# Patient Record
Sex: Male | Born: 1980 | Hispanic: No | Marital: Married | Smoking: Current every day smoker
Health system: Southern US, Community
[De-identification: ages and names within clinical notes are randomized; demographics above are authoritative.]

## PROBLEM LIST (undated history)

## (undated) ENCOUNTER — Ambulatory Visit: Discharge status: Absent without leave | Payer: Medicaid - Out of State

## (undated) DIAGNOSIS — I10 Essential (primary) hypertension: Secondary | ICD-10-CM

---

## 2008-08-17 ENCOUNTER — Emergency Department (HOSPITAL_COMMUNITY): Admission: EM | Admit: 2008-08-17 | Discharge: 2008-08-17 | Payer: Self-pay | Admitting: Emergency Medicine

## 2020-12-12 ENCOUNTER — Encounter (HOSPITAL_COMMUNITY): Payer: Self-pay | Admitting: Emergency Medicine

## 2020-12-12 ENCOUNTER — Emergency Department (HOSPITAL_COMMUNITY)
Admission: EM | Admit: 2020-12-12 | Discharge: 2020-12-12 | Disposition: A | Discharge status: Home / Self Care | Payer: Medicaid - Out of State | Attending: Emergency Medicine | Admitting: Emergency Medicine

## 2020-12-12 DIAGNOSIS — L02414 Cutaneous abscess of left upper limb: Secondary | ICD-10-CM | POA: Diagnosis not present

## 2020-12-12 DIAGNOSIS — I10 Essential (primary) hypertension: Secondary | ICD-10-CM | POA: Insufficient documentation

## 2020-12-12 DIAGNOSIS — L089 Local infection of the skin and subcutaneous tissue, unspecified: Secondary | ICD-10-CM | POA: Diagnosis present

## 2020-12-12 DIAGNOSIS — Z23 Encounter for immunization: Secondary | ICD-10-CM | POA: Diagnosis not present

## 2020-12-12 HISTORY — DX: Essential (primary) hypertension: I10

## 2020-12-12 MED ORDER — DOXYCYCLINE HYCLATE 100 MG PO TABS
100.0000 mg | ORAL_TABLET | Freq: Once | ORAL | Status: AC
  Administered 2020-12-12: 100 mg via ORAL
  Filled 2020-12-12: qty 1

## 2020-12-12 MED ORDER — LIDOCAINE-EPINEPHRINE 1 %-1:100000 IJ SOLN
20.0000 mL | Freq: Once | INTRAMUSCULAR | Status: AC
  Administered 2020-12-12: 20 mL
  Filled 2020-12-12: qty 1

## 2020-12-12 MED ORDER — DOXYCYCLINE HYCLATE 100 MG PO CAPS: 100.0000 mg | ORAL_CAPSULE | Freq: Two times a day (BID) | ORAL | 0 refills | Status: AC

## 2020-12-12 MED ORDER — IBUPROFEN 600 MG PO TABS: 600.0000 mg | ORAL_TABLET | Freq: Four times a day (QID) | ORAL | 0 refills | Status: AC | PRN

## 2020-12-12 MED ORDER — TETANUS-DIPHTH-ACELL PERTUSSIS 5-2.5-18.5 LF-MCG/0.5 IM SUSY
0.5000 mL | PREFILLED_SYRINGE | Freq: Once | INTRAMUSCULAR | Status: AC
  Administered 2020-12-12: 0.5 mL via INTRAMUSCULAR
  Filled 2020-12-12: qty 0.5

## 2020-12-12 NOTE — Discharge Instructions (Signed)
Please apply warm moist compress to affected area several times daily to help aid with healing.  Keep dressing over the wound.  Take antibiotic as prescribed.  Return if your symptoms worsen if you have fever or if you have any concern.

## 2020-12-12 NOTE — ED Provider Notes (Signed)
[facility] EMERGENCY DEPARTMENT Provider Note   CSN: [id number] Arrival date & time: 12/12/20  0941     History Chief Complaint  Patient presents with  . Insect Bite    Kerry Glover is a 40 y.o. male.  The history is provided by the patient. No language interpreter was used.     40 year old male with history of hypertension presenting for evaluation of skin infection.  Patient noticed a spot on his left forearm approximately a week ago as well as similar spots on his left thigh.  It has been painful, mildly itchy, throbbing, 5 out of 10 pain radiates down towards his hand with increased swelling for that duration.  Symptoms minimally improved with warm compress.  He believes he may have been bitten by insects at the place he is currently staying at.  He denies any change in soap detergent new pets or new medication.  Did endorse some fever several days prior.  He has not been vaccinated for COVID-19 but denies any COVID symptoms.  Unsure of his last tetanus status.  Past Medical History:  Diagnosis Date  . Hypertension     There are no problems to display for this patient.    The histories are not reviewed yet. Please review them in the "History" navigator section and refresh this SmartLink.     No family history on file.     Home Medications Prior to Admission medications   Not on File    Allergies    Lisinopril  Review of Systems   Review of Systems  Constitutional: Positive for fever.  Skin: Positive for rash.    Physical Exam Updated Vital Signs BP 118/71   Pulse 66   Temp 99.2 F (37.3 C) (Oral)   Resp 16   Ht 5\' 10"  (1.778 m)   Wt 72.6 kg   SpO2 100%   BMI 22.96 kg/m   Physical Exam Vitals and nursing note reviewed.  Constitutional:      General: He is not in acute distress.    Appearance: He is well-developed and well-nourished.  HENT:     Head: Atraumatic.  Eyes:     Conjunctiva/sclera: Conjunctivae normal.   Cardiovascular:     Rate and Rhythm: Normal rate and regular rhythm.     Pulses: Normal pulses.     Heart sounds: Normal heart sounds.  Pulmonary:     Effort: Pulmonary effort is normal.     Breath sounds: Normal breath sounds.  Abdominal:     General: Abdomen is flat.     Palpations: Abdomen is soft.  Musculoskeletal:     Cervical back: Neck supple.  Skin:    Findings: No rash.     Comments: Right forearm: There is a dime size area of ulceration noted to mid dorsum of the forearm with surrounding erythema and minimal fluctuance appreciated.  It is warm to the touch and tender.  3 more scattered area of mildly indurated without fluctuance lesion approximately less than 1 cm noted to posterior left thigh and medial left, mildly tender to palpation without surrounding erythema.  Neurological:     Mental Status: He is alert.  Psychiatric:        Mood and Affect: Mood and affect and mood normal.     ED Results / Procedures / Treatments   Labs (all labs ordered are listed, but only abnormal results are displayed) Labs Reviewed - No data to display  EKG None  Radiology No results  found.  Procedures .Marland KitchenIncision and Drainage  Date/Time: 12/12/2020 12:48 PM Performed by: Fayrene Helper, PA-C Authorized by: Fayrene Helper, PA-C   Consent:    Consent obtained:  Verbal   Consent given by:  Patient   Risks discussed:  Bleeding, incomplete drainage, pain and damage to other organs   Alternatives discussed:  No treatment Universal protocol:    Procedure explained and questions answered to patient or proxy's satisfaction: yes     Relevant documents present and verified: yes     Test results available : yes     Imaging studies available: yes     Required blood products, implants, devices, and special equipment available: yes     Site/side marked: yes     Immediately prior to procedure, a time out was called: yes     Patient identity confirmed:  Verbally with patient Location:     Type:  Abscess   Size:  3cm   Location:  Upper extremity   Upper extremity location:  Arm   Arm location:  L lower arm Pre-procedure details:    Skin preparation:  Betadine Sedation:    Sedation type:  None Anesthesia:    Anesthesia method:  Local infiltration   Local anesthetic:  Lidocaine 1% WITH epi Procedure type:    Complexity:  Simple Procedure details:    Ultrasound guidance: no     Needle aspiration: no     Incision types:  Single straight   Incision depth:  Subcutaneous   Scalpel blade:  11   Wound management:  Probed and deloculated   Drainage:  Purulent   Drainage amount:  Scant   Wound treatment:  Wound left open   Packing materials:  None Post-procedure details:    Procedure completion:  Tolerated well, no immediate complications   (including critical care time)  Medications Ordered in ED Medications - No data to display  ED Course  I have reviewed the triage vital signs and the nursing notes.  Pertinent labs & imaging results that were available during my care of the patient were reviewed by me and considered in my medical decision making (see chart for details).    MDM Rules/Calculators/A&P                          BP 118/71   Pulse 66   Temp 99.2 F (37.3 C) (Oral)   Resp 16   Ht 5\' 10"  (1.778 m)   Wt 72.6 kg   SpO2 100%   BMI 22.96 kg/m   Final Clinical Impression(s) / ED Diagnoses Final diagnoses:  Abscess of left forearm    Rx / DC Orders ED Discharge Orders         Ordered    doxycycline (VIBRAMYCIN) 100 MG capsule  2 times daily        12/12/20 1251    ibuprofen (ADVIL) 600 MG tablet  Every 6 hours PRN        12/12/20 1251         12:13 PM Patient here with multiple small lesions noted to his left posterior thigh as well as a larger lesion noted to his left mid forearm consistent with skin infection.  He has a small developing abscess to the mid forearm amenable for incision and drainage.  Will update tetanus, give antibiotic,  and will perform I&D.  He does not have any severe headache and nuchal rigidity concerning for meningitis.  Doubt tick bite, doubt Lyme  disease or [facility] spotted fever.  Not likely syphilitic infection.  12:49 PM Successful incision and drainage of left forearm abscess with scant purulent discharge that was expressed by me.  Will discharge home with antibiotic and return precaution.  Patient otherwise stable for discharge.   Fayrene Helper, PA-C 12/12/20 1252    Milagros Loll, MD 12/13/20 [phone]

## 2020-12-12 NOTE — ED Triage Notes (Signed)
Patient complains of what he believes was an insect bite on his left forearm and left thigh, first noticed wound approximately one week ago.

## 2021-04-21 ENCOUNTER — Other Ambulatory Visit: Payer: Self-pay

## 2021-04-21 ENCOUNTER — Encounter (HOSPITAL_COMMUNITY): Payer: Self-pay

## 2021-04-21 ENCOUNTER — Ambulatory Visit (HOSPITAL_COMMUNITY)
Admission: EM | Admit: 2021-04-21 | Discharge: 2021-04-21 | Disposition: A | Discharge status: Home / Self Care | Payer: 59 | Attending: Emergency Medicine | Admitting: Emergency Medicine

## 2021-04-21 DIAGNOSIS — F17210 Nicotine dependence, cigarettes, uncomplicated: Secondary | ICD-10-CM | POA: Insufficient documentation

## 2021-04-21 DIAGNOSIS — B349 Viral infection, unspecified: Secondary | ICD-10-CM | POA: Diagnosis present

## 2021-04-21 DIAGNOSIS — U071 COVID-19: Secondary | ICD-10-CM | POA: Diagnosis not present

## 2021-04-21 DIAGNOSIS — Z888 Allergy status to other drugs, medicaments and biological substances status: Secondary | ICD-10-CM | POA: Insufficient documentation

## 2021-04-21 NOTE — ED Provider Notes (Signed)
[facility]    CSN: [id number] Arrival date & time: 04/21/21  1657      History   Chief Complaint Chief Complaint  Patient presents with  . Headache  . Emesis    HPI Kerry Glover is a 40 y.o. male.   40 year old male patient presents emergency room with chief complaint of headache body aches chills fever for 3 days.  Patient states his wife and daughter have same.  Here for COVID test because work requires it.  Patient states he is improving, drinking well, feels better.  The history is provided by the patient. No language interpreter was used.    Past Medical History:  Diagnosis Date  . Hypertension     Patient Active Problem List   Diagnosis Date Noted  . Nonspecific syndrome suggestive of viral illness 04/21/2021    History reviewed. No pertinent surgical history.     Home Medications    Prior to Admission medications   Medication Sig Start Date End Date Taking? Authorizing Provider  doxycycline (VIBRAMYCIN) 100 MG capsule Take 1 capsule (100 mg total) by mouth 2 (two) times daily. One po bid x 7 days 12/12/20   Fayrene Helper, PA-C  ibuprofen (ADVIL) 600 MG tablet Take 1 tablet (600 mg total) by mouth every 6 (six) hours as needed. 12/12/20   Fayrene Helper, PA-C    Family History History reviewed. No pertinent family history.  Social History Social History   Tobacco Use  . Smoking status: Current Every Day Smoker  . Smokeless tobacco: Never Used     Allergies   Lisinopril   Review of Systems Review of Systems  Constitutional: Positive for chills and fever.  Gastrointestinal: Positive for diarrhea, nausea and vomiting.  Neurological: Positive for headaches.  All other systems reviewed and are negative.    Physical Exam Triage Vital Signs ED Triage Vitals  Enc Vitals Group     BP 04/21/21 1812 138/88     Pulse Rate 04/21/21 1812 (!) 57     Resp 04/21/21 1812 20     Temp 04/21/21 1812 98.4 F (36.9 C)     Temp Source  04/21/21 1812 Oral     SpO2 04/21/21 1812 100 %     Weight --      Height --      Head Circumference --      Peak Flow --      Pain Score 04/21/21 1811 0     Pain Loc --      Pain Edu? --      Excl. in GC? --    No data found.  Updated Vital Signs BP 138/88 (BP Location: Right Arm)   Pulse (!) 57   Temp 98.4 F (36.9 C) (Oral)   Resp 20   SpO2 100%   Visual Acuity Right Eye Distance:   Left Eye Distance:   Bilateral Distance:    Right Eye Near:   Left Eye Near:    Bilateral Near:     Physical Exam Vitals and nursing note reviewed.  Constitutional:      General: He is not in acute distress.    Appearance: He is well-developed and well-groomed. He is not ill-appearing or toxic-appearing.  HENT:     Head: Normocephalic.     Right Ear: Tympanic membrane is retracted.     Left Ear: Tympanic membrane is retracted.     Nose: Mucosal edema present.     Mouth/Throat:     Lips:  Pink.     Mouth: Mucous membranes are moist.     Pharynx: Oropharynx is clear. Uvula midline.  Eyes:     General: Lids are normal.     Conjunctiva/sclera: Conjunctivae normal.     Pupils: Pupils are equal, round, and reactive to light.  Cardiovascular:     Rate and Rhythm: Normal rate and regular rhythm.     Heart sounds: Normal heart sounds.  Pulmonary:     Effort: Pulmonary effort is normal. No respiratory distress.     Breath sounds: Normal breath sounds. No decreased breath sounds or wheezing.  Abdominal:     General: There is no distension.     Palpations: Abdomen is soft.  Musculoskeletal:        General: Normal range of motion.     Cervical back: Normal range of motion.  Skin:    General: Skin is warm and dry.     Findings: No rash.  Neurological:     General: No focal deficit present.     Mental Status: He is alert and oriented to person, place, and time.     GCS: GCS eye subscore is 4. GCS verbal subscore is 5. GCS motor subscore is 6.     Cranial Nerves: Cranial nerves are  intact. No cranial nerve deficit.     Sensory: Sensation is intact. No sensory deficit.     Motor: Motor function is intact.     Coordination: Coordination is intact.     Gait: Gait is intact.  Psychiatric:        Attention and Perception: Attention normal.        Mood and Affect: Mood normal.        Speech: Speech normal.        Behavior: Behavior normal. Behavior is cooperative.      UC Treatments / Results  Labs (all labs ordered are listed, but only abnormal results are displayed) Labs Reviewed  SARS CORONAVIRUS 2 (TAT 6-24 HRS)    EKG   Radiology No results found.  Procedures Procedures (including critical care time)  Medications Ordered in UC Medications - No data to display  Initial Impression / Assessment and Plan / UC Course  I have reviewed the triage vital signs and the nursing notes.  Pertinent labs & imaging results that were available during my care of the patient were reviewed by me and considered in my medical decision making (see chart for details).     ddx viral illness, URI, COVID Final Clinical Impressions(s) / UC Diagnoses   Final diagnoses:  Nonspecific syndrome suggestive of viral illness     Discharge Instructions     Rest, push fluids, take over-the-counter medicines as label directed for symptom management.  Follow-up with your PCP.  Follow your work protocol regarding quarantine    ED Prescriptions    None     PDMP not reviewed this encounter.   Clancy Gourd, NP 04/21/21 [phone]

## 2021-04-21 NOTE — ED Triage Notes (Signed)
Pt c/o body aches, headaches, vomiting x 2 days. He states he had a fever last night.

## 2021-04-21 NOTE — Discharge Instructions (Signed)
Rest, push fluids, take over-the-counter medicines as label directed for symptom management.  Follow-up with your PCP.  Follow your work protocol regarding quarantine

## 2021-04-22 LAB — SARS CORONAVIRUS 2 (TAT 6-24 HRS): SARS Coronavirus 2: POSITIVE — AB

## 2021-04-23 ENCOUNTER — Telehealth: Payer: Self-pay | Admitting: Adult Health

## 2021-04-23 NOTE — Telephone Encounter (Signed)
Called to discuss with patient about COVID-19 symptoms and the use of one of the available treatments for those with mild to moderate Covid symptoms and at a high risk of hospitalization.  Pt appears to qualify for outpatient treatment due to co-morbid conditions and/or a member of an at-risk group in accordance with the FDA Emergency Use Authorization.     Unable to reach pt - non working phone numbers in chart   Noreene Filbert

## 2021-06-21 ENCOUNTER — Emergency Department (HOSPITAL_COMMUNITY)
Admission: EM | Admit: 2021-06-21 | Discharge: 2021-06-21 | Disposition: A | Discharge status: Home / Self Care | Payer: 59 | Attending: Student | Admitting: Student

## 2021-06-21 ENCOUNTER — Emergency Department (HOSPITAL_COMMUNITY): Payer: 59

## 2021-06-21 ENCOUNTER — Other Ambulatory Visit: Payer: Self-pay

## 2021-06-21 DIAGNOSIS — R519 Headache, unspecified: Secondary | ICD-10-CM | POA: Diagnosis present

## 2021-06-21 DIAGNOSIS — Z5321 Procedure and treatment not carried out due to patient leaving prior to being seen by health care provider: Secondary | ICD-10-CM | POA: Insufficient documentation

## 2021-06-21 LAB — CBC
HCT: 37.6 % — ABNORMAL LOW (ref 39.0–52.0)
Hemoglobin: 12 g/dL — ABNORMAL LOW (ref 13.0–17.0)
MCH: 26.9 pg (ref 26.0–34.0)
MCHC: 31.9 g/dL (ref 30.0–36.0)
MCV: 84.3 fL (ref 80.0–100.0)
Platelets: 261 10*3/uL (ref 150–400)
RBC: 4.46 MIL/uL (ref 4.22–5.81)
RDW: 16.4 % — ABNORMAL HIGH (ref 11.5–15.5)
WBC: 10.3 10*3/uL (ref 4.0–10.5)
nRBC: 0 % (ref 0.0–0.2)

## 2021-06-21 LAB — BASIC METABOLIC PANEL
Anion gap: 5 (ref 5–15)
BUN: 12 mg/dL (ref 6–20)
CO2: 26 mmol/L (ref 22–32)
Calcium: 8.7 mg/dL — ABNORMAL LOW (ref 8.9–10.3)
Chloride: 108 mmol/L (ref 98–111)
Creatinine, Ser: 0.92 mg/dL (ref 0.61–1.24)
GFR, Estimated: >60 mL/min (ref >60)
Glucose, Bld: 82 mg/dL (ref 70–99)
Potassium: 4 mmol/L (ref 3.5–5.1)
Sodium: 139 mmol/L (ref 135–145)

## 2021-06-21 MED ORDER — ACETAMINOPHEN 500 MG PO TABS
1000.0000 mg | ORAL_TABLET | Freq: Once | ORAL | Status: AC
  Administered 2021-06-21: 1000 mg via ORAL
  Filled 2021-06-21: qty 2

## 2021-06-21 NOTE — ED Triage Notes (Signed)
Pt reports being out of Losartan x 2 days. Endorses headache. Advised to come to ED by PCP.

## 2021-06-21 NOTE — ED Provider Notes (Signed)
Emergency Medicine Provider Triage Evaluation Note  Kerry Glover , a 40 y.o. male  was evaluated in triage.  Pt complains of headache.  Patient states he has a history of high blood pressure, has been out of his blood pressure medicine for several days.  He recently moved to the area, does not a PCP and thus is not able to get a refill.  He woke up today with a headache, thus came to the ER.  He has not taken anything for it.  No other neurologic deficits.  No chest pain.  The only other medication he takes is BuSpar.  Review of Systems  Positive: ha Negative: cp  Physical Exam  BP (!) 147/98   Pulse (!) 55   Temp 98.3 F (36.8 C) (Oral)   Resp 16   SpO2 100%  Gen:   Awake, no distress   Resp:  Normal effort  MSK:   Moves extremities without difficulty  Other:  CN intact.  Strength and sensation intact x4.  Negative pronator.  Medical Decision Making  Medically screening exam initiated at 10:55 AM.  Appropriate orders placed.  [name] was informed that the remainder of the evaluation will be completed by another provider, this initial triage assessment does not replace that evaluation, and the importance of remaining in the ED until their evaluation is complete.  Due to HA, will order ct head and basic labs   Alveria Apley, PA-C 06/21/21 1055    Tegeler, Canary Brim, MD 06/21/21 1601

## 2021-06-21 NOTE — ED Notes (Signed)
Pt called for vitals, no response. 

## 2021-06-21 NOTE — ED Notes (Signed)
Pt called with no response 

## 2022-12-22 IMAGING — CT CT HEAD W/O CM
4 of 5 series · 14 of 47 positions shown, 16 images · non-contrast
Comparison: None.

CLINICAL DATA: Headache.

EXAM:
CT HEAD WITHOUT CONTRAST
TECHNIQUE: Contiguous axial images were obtained from the base of the skull
through the vertex without intravenous contrast.

[Series 3: head without · axial · non-contrast · 0.43mm/px · z∈[-35,+25]mm · 3 of 31 slices shown]
[im 7/31  brain]
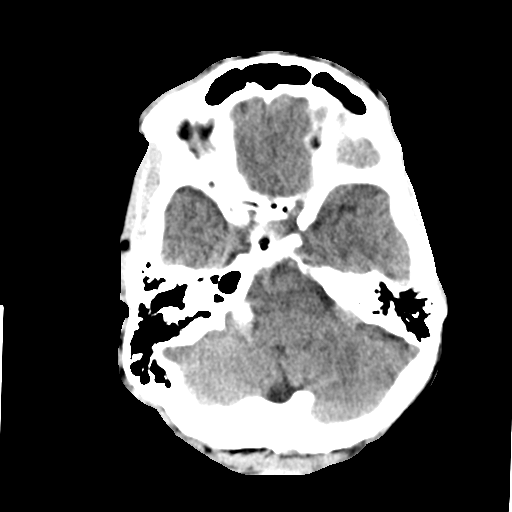
[im 13/31  brain]
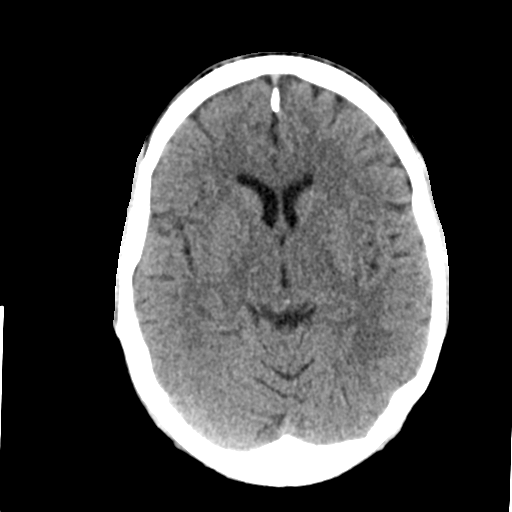
[im 19/31  brain]
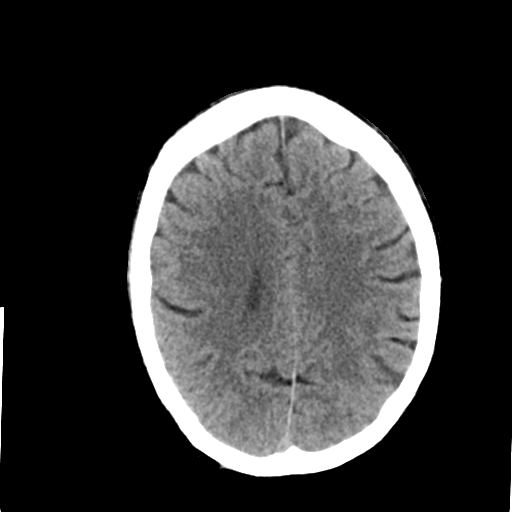

[Series 5: head without cor · coronal · non-contrast · 0.29mm/px · 3 of 65 slices shown]
[im 22/65  brain]
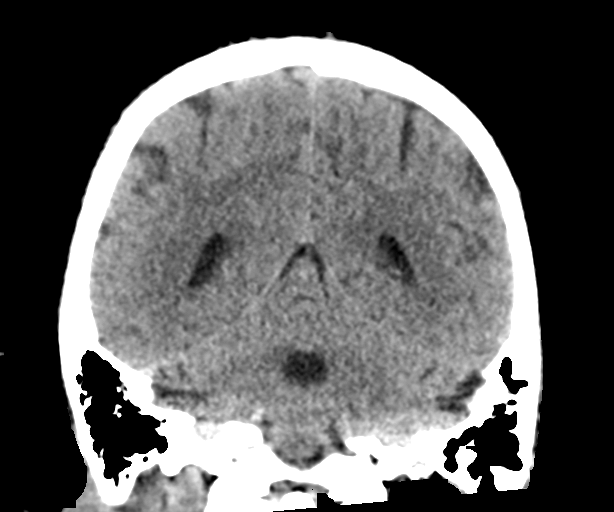
[im 29/65  brain]
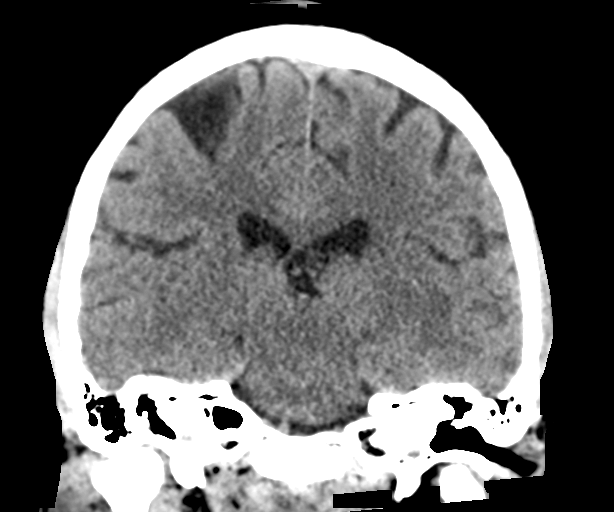
[im 36/65  brain]
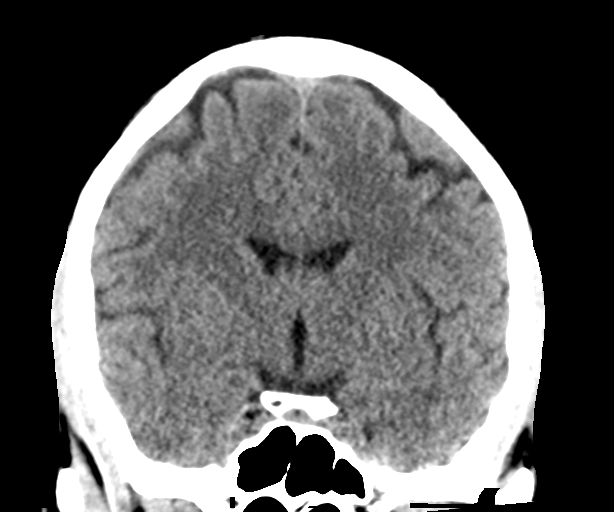

[Series 6: head without sag · sagittal · non-contrast · 0.29mm/px · 3 of 50 slices shown]
[im 17/50  brain]
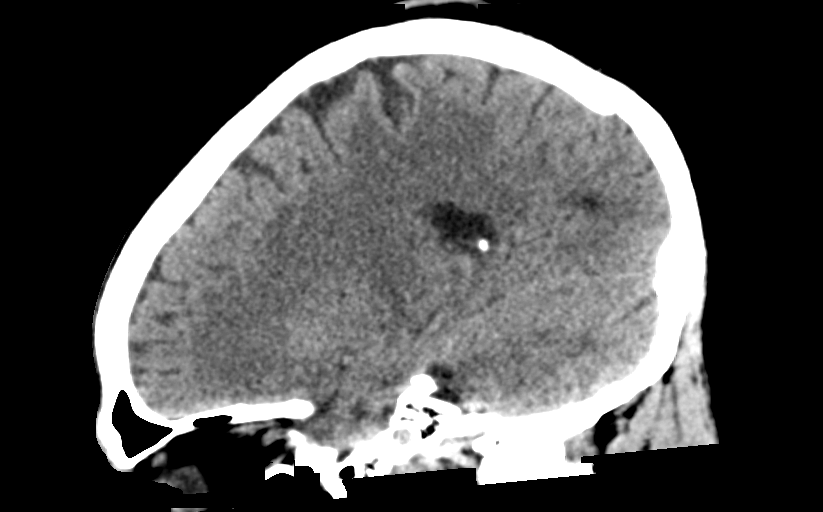
[im 25/50  brain]
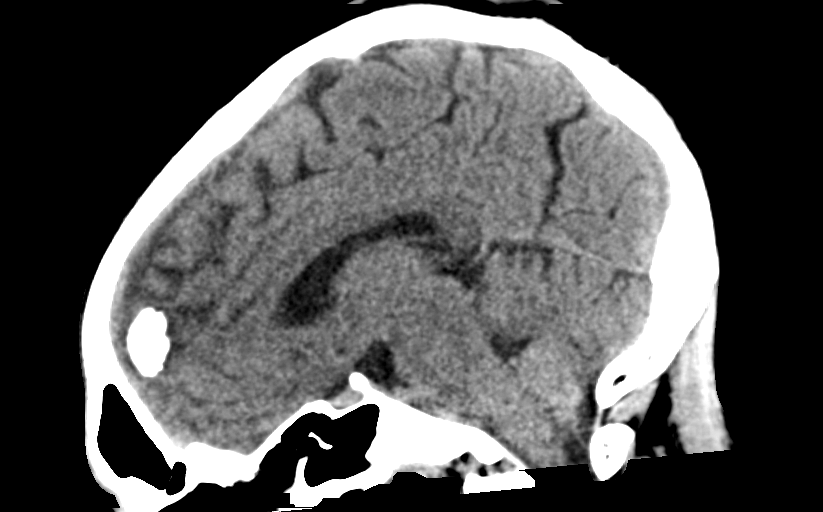
[im 33/50  brain]
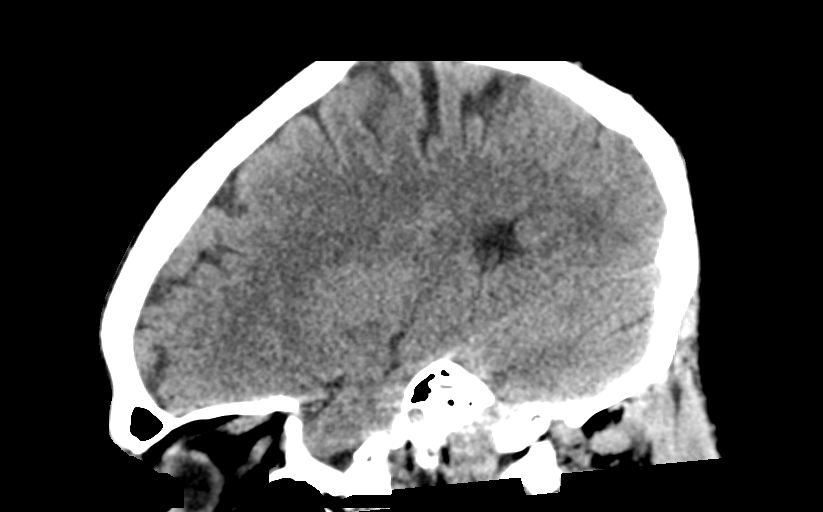

[Series 7: head without ax · axial · non-contrast · 0.38mm/px · z∈[-22,+78]mm · 5 of 31 slices shown, 7 images]
[im 6/31  brain]
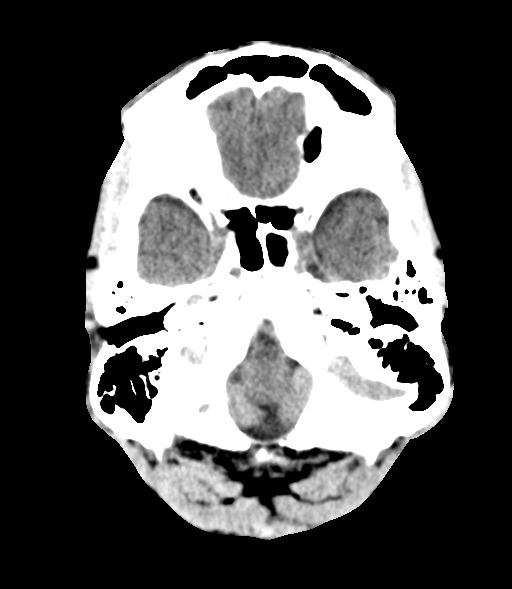
[im 6/31  bone]
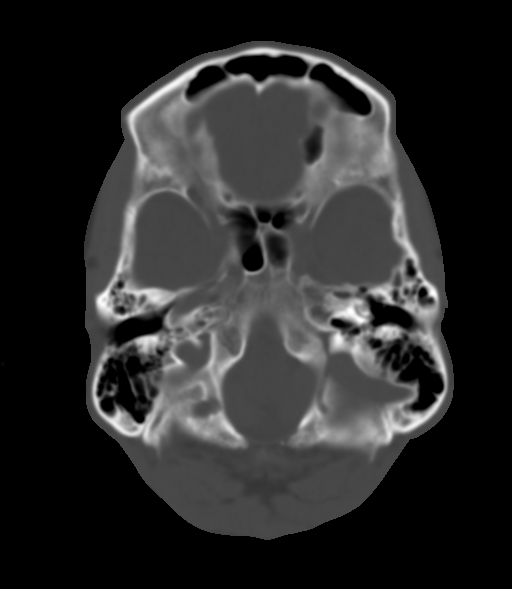
[im 11/31  brain]
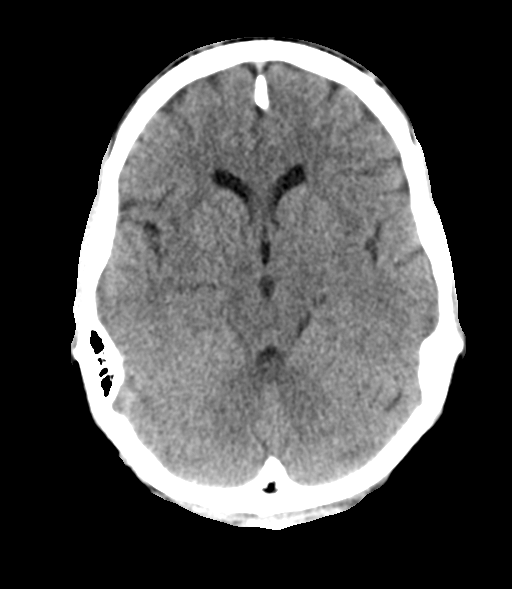
[im 16/31  brain]
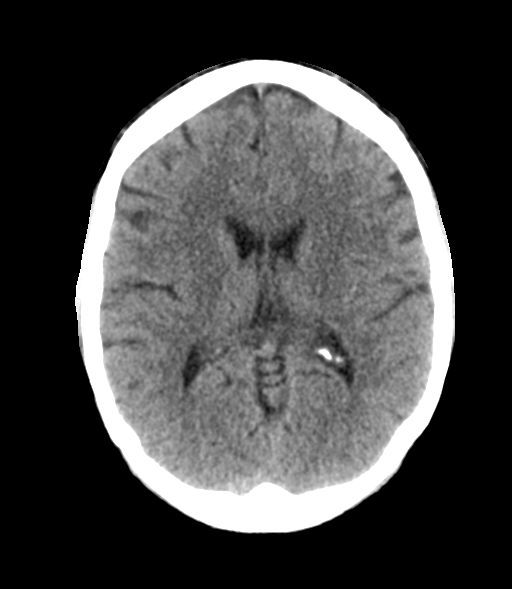
[im 21/31  brain]
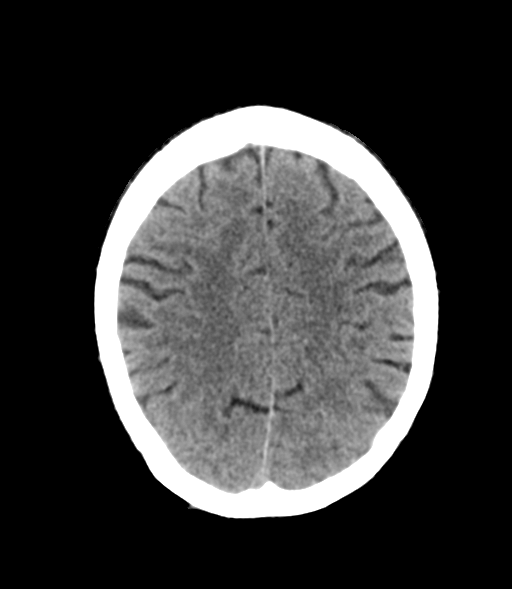
[im 26/31  brain]
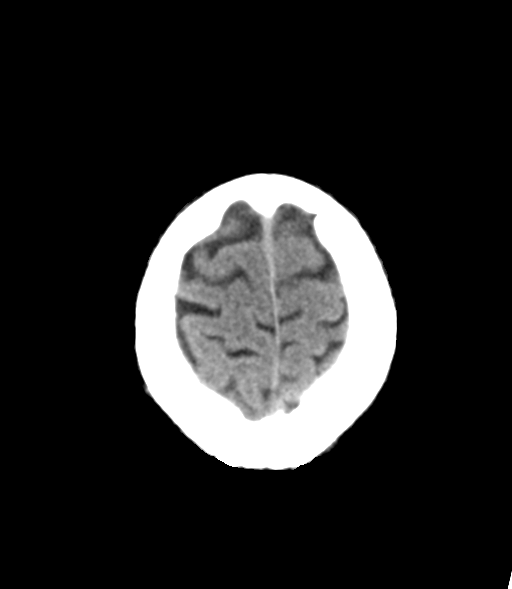
[im 26/31  bone]
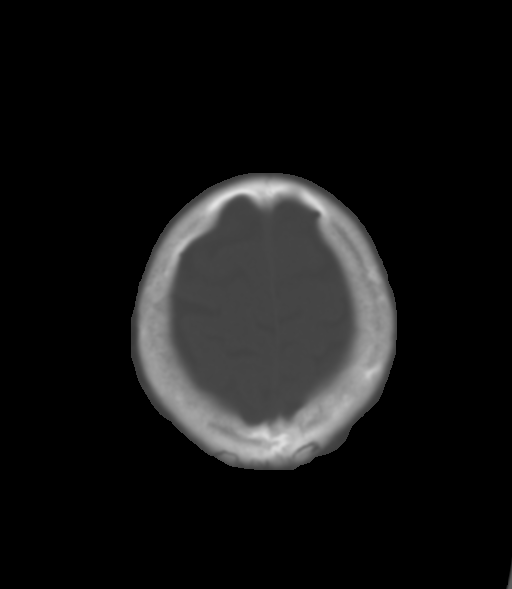

[14 of 47 positions shown; findings below may reference images not displayed]

FINDINGS: Brain: No evidence of acute infarction, hemorrhage, hydrocephalus,
extra-axial collection or mass lesion/mass effect.

Vascular: No hyperdense vessel or unexpected calcification.

Skull: Normal. Negative for fracture or focal lesion.

Sinuses/Orbits: A small amount of fluid is seen in inferior
posterior left mastoid air cells without bony erosion or overlying
soft tissue swelling. Sinuses are normal.

Other: None.
IMPRESSION: 1. Minimal fluid in left mastoid air cells without bony erosion or
soft tissue swelling.
2. No acute intracranial abnormalities.

## 2023-06-16 ENCOUNTER — Other Ambulatory Visit: Payer: Self-pay

## 2023-06-16 ENCOUNTER — Emergency Department (HOSPITAL_COMMUNITY)
Admission: EM | Admit: 2023-06-16 | Discharge: 2023-06-16 | Disposition: A | Discharge status: Home / Self Care | Payer: 59 | Attending: Emergency Medicine | Admitting: Emergency Medicine

## 2023-06-16 DIAGNOSIS — I10 Essential (primary) hypertension: Secondary | ICD-10-CM | POA: Insufficient documentation

## 2023-06-16 DIAGNOSIS — Z87891 Personal history of nicotine dependence: Secondary | ICD-10-CM | POA: Insufficient documentation

## 2023-06-16 DIAGNOSIS — E860 Dehydration: Secondary | ICD-10-CM | POA: Insufficient documentation

## 2023-06-16 DIAGNOSIS — E871 Hypo-osmolality and hyponatremia: Secondary | ICD-10-CM | POA: Diagnosis not present

## 2023-06-16 DIAGNOSIS — R42 Dizziness and giddiness: Secondary | ICD-10-CM | POA: Diagnosis present

## 2023-06-16 LAB — COMPREHENSIVE METABOLIC PANEL
ALT: 20 U/L (ref 0–44)
AST: 29 U/L (ref 15–41)
Albumin: 3.7 g/dL (ref 3.5–5.0)
Alkaline Phosphatase: 47 U/L (ref 38–126)
Anion gap: 13 (ref 5–15)
BUN: 18 mg/dL (ref 6–20)
CO2: 22 mmol/L (ref 22–32)
Calcium: 8.8 mg/dL — ABNORMAL LOW (ref 8.9–10.3)
Chloride: 92 mmol/L — ABNORMAL LOW (ref 98–111)
Creatinine, Ser: 1.59 mg/dL — ABNORMAL HIGH (ref 0.61–1.24)
GFR, Estimated: 55 mL/min — ABNORMAL LOW (ref >60)
Glucose, Bld: 123 mg/dL — ABNORMAL HIGH (ref 70–99)
Potassium: 4 mmol/L (ref 3.5–5.1)
Sodium: 127 mmol/L — ABNORMAL LOW (ref 135–145)
Total Bilirubin: 0.7 mg/dL (ref 0.3–1.2)
Total Protein: 6.2 g/dL — ABNORMAL LOW (ref 6.5–8.1)

## 2023-06-16 LAB — URINALYSIS, ROUTINE W REFLEX MICROSCOPIC
Bilirubin Urine: NEGATIVE
Glucose, UA: NEGATIVE mg/dL
Hgb urine dipstick: NEGATIVE
Ketones, ur: NEGATIVE mg/dL
Leukocytes,Ua: NEGATIVE
Nitrite: NEGATIVE
Protein, ur: NEGATIVE mg/dL
Specific Gravity, Urine: 1.013 (ref 1.005–1.030)
pH: 5 (ref 5.0–8.0)

## 2023-06-16 LAB — CBC
HCT: 39.3 % (ref 39.0–52.0)
Hemoglobin: 13.3 g/dL (ref 13.0–17.0)
MCH: 28.1 pg (ref 26.0–34.0)
MCHC: 33.8 g/dL (ref 30.0–36.0)
MCV: 83.1 fL (ref 80.0–100.0)
Platelets: 286 10*3/uL (ref 150–400)
RBC: 4.73 MIL/uL (ref 4.22–5.81)
RDW: 13.9 % (ref 11.5–15.5)
WBC: 10.5 10*3/uL (ref 4.0–10.5)
nRBC: 0 % (ref 0.0–0.2)

## 2023-06-16 LAB — CK: Total CK: 267 U/L (ref 49–397)

## 2023-06-16 MED ORDER — LACTATED RINGERS IV BOLUS
2000.0000 mL | Freq: Once | INTRAVENOUS | Status: AC
  Administered 2023-06-16: 2000 mL via INTRAVENOUS

## 2023-06-16 NOTE — ED Provider Notes (Signed)
[place] EMERGENCY DEPARTMENT AT [facility] Provider Note   CSN: [id number] Arrival date & time: 06/16/23  1159     History  Chief Complaint  Patient presents with   Dizziness   Shortness of Breath    Kerry Glover is a 42 y.o. male.  Patient is a 42 year old male with a history of hypertension, tobacco use who is presenting today with several complaints.  Patient reports for the last 2 weeks he has been having feelings of dizziness and near syncope when he stands up and tries to walk.  This is usually while he is at work.  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  He does report he is drinking a lot of water and only water when he is at work but even when he is walking and trying to lift things he has noticed that he started to have some burning discomfort in his arms and legs which resolves when he rests.  He denies any chest pain and states just when he gets the dizzy episodes he will feel short of breath and he thinks that is more related to being anxious.  He is compliant with his blood pressure medications which include a ARB and hydrochlorothiazide.  He has been eating and denies any vomiting or diarrhea.  He states typically he will have 1 or 2 beers per night but does not drink any more than that.  The history is provided by the patient and medical records.  Dizziness Associated symptoms: shortness of breath   Shortness of Breath      Home Medications Prior to Admission medications   Medication Sig Start Date End Date Taking? Authorizing Provider  doxycycline (VIBRAMYCIN) 100 MG capsule Take 1 capsule (100 mg total) by mouth 2 (two) times daily. One po bid x 7 days 12/12/20   Fayrene Helper, PA-C  ibuprofen (ADVIL) 600 MG tablet Take 1 tablet (600 mg total) by mouth every 6 (six) hours as needed. 12/12/20   Fayrene Helper, PA-C      Allergies    Coconut flavor and Lisinopril    Review  of Systems   Review of Systems  Respiratory:  Positive for shortness of breath.   Neurological:  Positive for dizziness.    Physical Exam Updated Vital Signs BP 126/79 (BP Location: Left Arm)   Pulse (!) 59   Temp 97.8 F (36.6 C) (Oral)   Resp 16   Ht 5\' 10"  (1.778 m)   Wt 67.1 kg   SpO2 100%   BMI 21.24 kg/m  Physical Exam Vitals and nursing note reviewed.  Constitutional:      General: He is not in acute distress.    Appearance: He is well-developed.  HENT:     Head: Normocephalic and atraumatic.  Eyes:     Conjunctiva/sclera: Conjunctivae normal.     Pupils: Pupils are equal, round, and reactive to light.  Cardiovascular:     Rate and Rhythm: Normal rate and regular rhythm.     Heart sounds: No murmur heard. Pulmonary:     Effort: Pulmonary effort is normal. No respiratory distress.     Breath sounds: Normal breath sounds. No wheezing or rales.  Abdominal:     General: There is no distension.     Palpations: Abdomen is soft.     Tenderness: There is no abdominal tenderness. There is no guarding or rebound.  Musculoskeletal:        General: No tenderness. Normal range of motion.     Cervical back: Normal range of motion and neck supple.     Right lower leg: No tenderness.     Left lower leg: No tenderness.  Skin:    General: Skin is warm and dry.     Findings: No erythema or rash.  Neurological:     Mental Status: He is alert and oriented to person, place, and time.  Psychiatric:        Behavior: Behavior normal.     ED Results / Procedures / Treatments   Labs (all labs ordered are listed, but only abnormal results are displayed) Labs Reviewed  COMPREHENSIVE METABOLIC PANEL - Abnormal; Notable for the following components:      Result Value   Sodium 127 (*)    Chloride 92 (*)    Glucose, Bld 123 (*)    Creatinine, Ser 1.59 (*)    Calcium 8.8 (*)    Total Protein 6.2 (*)    GFR, Estimated 55 (*)    All other components within normal limits   URINALYSIS, ROUTINE W REFLEX MICROSCOPIC - Abnormal; Notable for the following components:   APPearance HAZY (*)    All other components within normal limits  CBC  CK    EKG EKG Interpretation Date/Time:  Wednesday June 16 2023 12:24:40 EDT Ventricular Rate:  74 PR Interval:  134 QRS Duration:  102 QT Interval:  410 QTC Calculation: 455 R Axis:   64  Text Interpretation: Normal sinus rhythm Normal ECG No previous ECGs available Confirmed by Gwyneth Sprout ([id number]) on 06/16/2023 4:26:38 PM  Radiology No results found.  Procedures Procedures    Medications Ordered in ED Medications  lactated ringers bolus 2,000 mL (0 mLs Intravenous Stopped 06/16/23 1817)    ED Course/ Medical Decision Making/ A&P                             Medical Decision Making Amount and/or Complexity of Data Reviewed External Data Reviewed: notes. Labs: ordered. Decision-making details documented in ED Course. ECG/medicine tests: ordered and independent interpretation performed. Decision-making details documented in ED Course.   Pt with multiple medical problems and comorbidities and presenting today with a complaint that caries a high risk for morbidity and mortality.  Here today with several complaints related to near syncope, dizziness and some burning in his arms and legs.  This is also in the setting of working at a job that does not air conditioning for the last 2 weeks when temperatures have been greater than 100 degrees and inside.  This is also in the setting of patient taking blood pressure medication.  He does report that he is drinking only water all the time to try to stay hydrated.  Patient is well-appearing on exam has no focal deficits but concern for dehydration from extreme temperatures and being on blood pressure medications, electrolyte abnormalities.  Low suspicion for ACS.  Patient has been following with a cardiologist recently had an echo which was relatively normal.  I  independently interpreted patient's EKG and labs today which looked within normal limits, CBC within normal limits, CMP with new hyponatremia of 127 and AKI with creatinine 1.57 from baseline of 0.9, potassium levels are normal, anion gap and CK are within normal limits.   Feel that patient's hyponatremia may be related to both dehydration and excessive water intake without any  electrolyte replacement.  Also patient does have some mild AKI again most likely from the extreme temperatures.  Findings discussed with the patient's.  He was hydrated with LR.  He has an appointment with his PCP tomorrow and can have his labs rechecked.  Will also take him out of work the rest of this week as he is on 7-3 to avoid the extreme temperatures.  He is comfortable with this plan.  Feel that patient is stable for discharge home.  Patient does not require admission at this time.         Final Clinical Impression(s) / ED Diagnoses Final diagnoses:  Dehydration  Hyponatremia    Rx / DC Orders ED Discharge Orders     None         Gwyneth Sprout, MD 06/16/23 [phone]

## 2023-06-16 NOTE — Discharge Instructions (Addendum)
Avoid drinking just plain water but make sure you are drinking something that has electrolytes.  A cool and avoid extreme temperatures.  Get plenty of rest in the next few days.  Follow-up with your doctor tomorrow as planned so they can recheck your blood work.

## 2023-06-16 NOTE — ED Triage Notes (Signed)
Pt reports for the last week he's been having postural dizziness and SOB. Denies CP.

## 2023-06-16 NOTE — ED Provider Triage Note (Signed)
Emergency Medicine Provider Triage Evaluation Note  Kerry Glover , a 42 y.o. male  was evaluated in triage.  History of hypertension.  Concern for 1 week of intermittent dizziness and headache.  The dizziness comes out for about 1 minute at a time.  Mostly when he stands up.  States it feels like feels like he is going to pass out with some blurriness in vision.  He has also had some episodes when just walking around.  He has been working in [employer] product/process [employer].  Notices urine is darker than normal.  He is being treated for hypertension and reports blood pressures are usually 139/90 when on his blood pressure medication. No fever or chills. No shortness of breath or chest pain.  Frontal headache that is intermittent.  He does have a history of headaches.  Denies any head trauma, changes in vision.  Review of Systems  Positive: As above Negative: Above  Physical Exam  BP 111/71 (BP Location: Right Arm)   Pulse 68   Temp 97.8 F (36.6 C) (Oral)   Resp 18   Ht 5\' 10"  (1.778 m)   Wt 67.1 kg   SpO2 100%   BMI 21.24 kg/m  Gen:   Awake, no distress   Resp:  Normal effort  MSK:   Moves extremities without difficulty  Other:  Regular rate and rhythm. Pupils equal round and reactive to light, EOM  intact  Medical Decision Making  Medically screening exam initiated at 12:29 PM.  Appropriate orders placed.  [name] was informed that the remainder of the evaluation will be completed by another provider, this initial triage assessment does not replace that evaluation, and the importance of remaining in the ED until their evaluation is complete.     Arabella Merles, PA-C 06/16/23 1248
